# Patient Record
Sex: Female | Born: 1945 | Race: White | Hispanic: No | Marital: Married | Smoking: Never smoker
Health system: Southern US, Community
[De-identification: ages and names within clinical notes are randomized; demographics above are authoritative.]

## PROBLEM LIST (undated history)

## (undated) DIAGNOSIS — Z8679 Personal history of other diseases of the circulatory system: Secondary | ICD-10-CM

## (undated) DIAGNOSIS — R0989 Other specified symptoms and signs involving the circulatory and respiratory systems: Secondary | ICD-10-CM

## (undated) DIAGNOSIS — K116 Mucocele of salivary gland: Secondary | ICD-10-CM

## (undated) DIAGNOSIS — M8580 Other specified disorders of bone density and structure, unspecified site: Secondary | ICD-10-CM

## (undated) DIAGNOSIS — E782 Mixed hyperlipidemia: Secondary | ICD-10-CM

## (undated) DIAGNOSIS — Z87898 Personal history of other specified conditions: Secondary | ICD-10-CM

## (undated) DIAGNOSIS — I10 Essential (primary) hypertension: Secondary | ICD-10-CM

## (undated) DIAGNOSIS — K219 Gastro-esophageal reflux disease without esophagitis: Secondary | ICD-10-CM

## (undated) HISTORY — PX: TONSILLECTOMY: SUR1361

## (undated) HISTORY — DX: Personal history of other diseases of the circulatory system: Z86.79

## (undated) HISTORY — DX: Essential (primary) hypertension: I10

## (undated) HISTORY — DX: Personal history of other specified conditions: Z87.898

## (undated) HISTORY — DX: Other specified disorders of bone density and structure, unspecified site: M85.80

## (undated) HISTORY — DX: Mixed hyperlipidemia: E78.2

## (undated) HISTORY — DX: Mucocele of salivary gland: K11.6

## (undated) HISTORY — DX: Gastro-esophageal reflux disease without esophagitis: K21.9

## (undated) HISTORY — DX: Other specified symptoms and signs involving the circulatory and respiratory systems: R09.89

## (undated) HISTORY — PX: BREAST BIOPSY: SHX20

## (undated) HISTORY — PX: ABDOMINAL HYSTERECTOMY: SHX81

---

## 2000-03-20 HISTORY — PX: CARPAL TUNNEL RELEASE: SHX101

## 2013-08-04 ENCOUNTER — Ambulatory Visit: Payer: Self-pay | Admitting: Physician Assistant

## 2013-08-15 DIAGNOSIS — K219 Gastro-esophageal reflux disease without esophagitis: Secondary | ICD-10-CM

## 2013-08-15 DIAGNOSIS — I10 Essential (primary) hypertension: Secondary | ICD-10-CM | POA: Insufficient documentation

## 2013-08-15 DIAGNOSIS — E782 Mixed hyperlipidemia: Secondary | ICD-10-CM

## 2013-08-15 HISTORY — DX: Gastro-esophageal reflux disease without esophagitis: K21.9

## 2013-08-15 HISTORY — DX: Essential (primary) hypertension: I10

## 2013-08-15 HISTORY — DX: Mixed hyperlipidemia: E78.2

## 2013-10-29 ENCOUNTER — Ambulatory Visit: Payer: Self-pay | Admitting: Family Medicine

## 2013-10-30 DIAGNOSIS — R0989 Other specified symptoms and signs involving the circulatory and respiratory systems: Secondary | ICD-10-CM

## 2013-10-30 DIAGNOSIS — R198 Other specified symptoms and signs involving the digestive system and abdomen: Secondary | ICD-10-CM

## 2013-10-30 DIAGNOSIS — K116 Mucocele of salivary gland: Secondary | ICD-10-CM | POA: Insufficient documentation

## 2013-10-30 HISTORY — DX: Other specified symptoms and signs involving the digestive system and abdomen: R19.8

## 2013-10-30 HISTORY — DX: Other specified symptoms and signs involving the circulatory and respiratory systems: R09.89

## 2013-10-30 HISTORY — DX: Mucocele of salivary gland: K11.6

## 2014-02-26 DIAGNOSIS — M8580 Other specified disorders of bone density and structure, unspecified site: Secondary | ICD-10-CM

## 2014-02-26 HISTORY — DX: Other specified disorders of bone density and structure, unspecified site: M85.80

## 2014-11-12 ENCOUNTER — Other Ambulatory Visit: Payer: Self-pay | Admitting: Family Medicine

## 2014-11-12 DIAGNOSIS — Z1231 Encounter for screening mammogram for malignant neoplasm of breast: Secondary | ICD-10-CM

## 2014-11-17 ENCOUNTER — Ambulatory Visit
Admission: RE | Admit: 2014-11-17 | Discharge: 2014-11-17 | Disposition: A | Discharge status: Home / Self Care | Payer: Medicare Other | Source: Ambulatory Visit | Attending: Family Medicine | Admitting: Family Medicine

## 2014-11-17 DIAGNOSIS — Z1231 Encounter for screening mammogram for malignant neoplasm of breast: Secondary | ICD-10-CM | POA: Diagnosis not present

## 2015-02-16 ENCOUNTER — Encounter: Payer: Self-pay | Admitting: Urology

## 2015-02-16 ENCOUNTER — Ambulatory Visit (INDEPENDENT_AMBULATORY_CARE_PROVIDER_SITE_OTHER): Payer: Medicare Other | Admitting: Urology

## 2015-02-16 VITALS — BP 128/81 | HR 85 | Ht 67.0 in | Wt 186.5 lb

## 2015-02-16 DIAGNOSIS — R351 Nocturia: Secondary | ICD-10-CM

## 2015-02-16 DIAGNOSIS — R3129 Other microscopic hematuria: Secondary | ICD-10-CM

## 2015-02-16 DIAGNOSIS — Z87898 Personal history of other specified conditions: Secondary | ICD-10-CM

## 2015-02-16 DIAGNOSIS — Z8679 Personal history of other diseases of the circulatory system: Secondary | ICD-10-CM | POA: Insufficient documentation

## 2015-02-16 HISTORY — DX: Personal history of other specified conditions: Z87.898

## 2015-02-16 HISTORY — DX: Personal history of other diseases of the circulatory system: Z86.79

## 2015-02-16 LAB — URINALYSIS, COMPLETE
BILIRUBIN UA: NEGATIVE
GLUCOSE, UA: NEGATIVE
KETONES UA: NEGATIVE
NITRITE UA: NEGATIVE
Protein, UA: NEGATIVE
Urobilinogen, Ur: 0.2 mg/dL (ref 0.2–1.0)
pH, UA: 6 (ref 5.0–7.5)

## 2015-02-16 LAB — MICROSCOPIC EXAMINATION
Bacteria, UA: NONE SEEN
RBC MICROSCOPIC, UA: NONE SEEN /HPF (ref 0–?)

## 2015-02-16 NOTE — Progress Notes (Signed)
02/16/2015 9:48 AM   Denise Cisneros 09/08/1945 [id number]  Referring provider: Berneice GandyVicki S Fowler, MD [address] [address] [place], [place] [id number]  Chief Complaint  Patient presents with  . Hematuria    New Patient    HPI: The patient is a new patient who was found recently to have microscopic hematuria. The urine culture was negative on that visit. Of the urinalysis have been negative. She does not have a history of taking daily aspirin or blood thinners or smoking. She's had a hysterectomy. She's never had a kidney stone or previous GU surgery  She voids every every 3 hours is on once a night. She is not sure she is emptying since she can double void a milder amount  Modifying factors: There are no other modifying factors  Associated signs and symptoms: There are no other associated signs and symptoms Aggravating and relieving factors: There are no other aggravating or relieving factors Severity: Mild Duration: Persistent       PMH: Past Medical History  Diagnosis Date  . History of prolonged Q-T interval on ECG 02/16/2015    Overview:  comes and goes, negative stress test and normal ECHO 2014   . BP (high blood pressure) 08/15/2013  . Acid reflux 08/15/2013  . 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 Osteopenia 02/26/2014  . Globus pharyngeus 10/30/2013  . 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 Personal history of other diseases of the circulatory system 02/16/2015    Overview:  comes and goes, negative stress test and normal ECHO 2014     Surgical History: Past Surgical History  Procedure Laterality Date  . Breast biopsy Right 20 yrs ago    neg bx 2 times  . Abdominal hysterectomy    . Cesarean section    . Tonsillectomy    . Carpal tunnel release  2002    Home Medications:    Medication List       This list is accurate as of: 02/16/15  9:48 AM.  Always use your most recent med list.               atorvastatin 20 MG tablet    Commonly known as:  LIPITOR  TK 1 T PO QD        Allergies: No Known Allergies  Family History: Family History  Problem Relation Age of Onset  . Breast cancer Maternal Aunt 49  . Prostate cancer Neg Hx   . Kidney cancer Neg Hx   . Bladder Cancer Neg Hx     Social History:  reports that she has never smoked. She does not have any smokeless tobacco history on file. She reports that she does not drink alcohol or use illicit drugs.  ROS: UROLOGY Frequent Urination?: No Hard to postpone urination?: No Burning/pain with urination?: No Get up at night to urinate?: Yes Leakage of urine?: No Urine stream starts and stops?: No Trouble starting stream?: No Do you have to strain to urinate?: No Blood in urine?: Yes Urinary tract infection?: Yes Sexually transmitted disease?: No Injury to kidneys or bladder?: No Painful intercourse?: No Weak stream?: No Currently pregnant?: No Vaginal bleeding?: No Last menstrual period?: n  Gastrointestinal Nausea?: No Vomiting?: No Indigestion/heartburn?: No Diarrhea?: No Constipation?: No  Constitutional Fever: No Night sweats?: No Weight loss?: No Fatigue?: No  Skin Skin rash/lesions?: No Itching?: No  Eyes Blurred vision?: No Double vision?: No  Ears/Nose/Throat Sore throat?: No Sinus problems?: No  Hematologic/Lymphatic Swollen glands?:  No Easy bruising?: No  Cardiovascular Leg swelling?: No Chest pain?: No  Respiratory Cough?: No Shortness of breath?: No  Endocrine Excessive thirst?: No  Musculoskeletal Back pain?: No Joint pain?: No  Neurological Headaches?: No Dizziness?: No  Psychologic Depression?: No Anxiety?: No  Physical Exam: BP 128/81 mmHg  Pulse 85  Ht  (1.702 m)  Wt 186 lb 8 oz (84.596 kg)  BMI 29.20 kg/m2  Constitutional:  Alert and oriented, No acute distress. HEENT: [place] AT, moist mucus membranes.  Trachea midline, no masses. Cardiovascular: No clubbing, cyanosis, or  edema. Respiratory: Normal respiratory effort, no increased work of breathing. GI: Abdomen is soft, nontender, nondistended, no abdominal masses GU: No CVA tenderness.  Skin: No rashes, bruises or suspicious lesions. Lymph: No cervical or inguinal adenopathy. Neurologic: Grossly intact, no focal deficits, moving all 4 extremities. Psychiatric: Normal mood and affect.  Laboratory Data:  Urinalysis No results found for: COLORURINE, APPEARANCEUR, LABSPEC, PHURINE, GLUCOSEU, HGBUR, BILIRUBINUR, KETONESUR, PROTEINUR, UROBILINOGEN, NITRITE, LEUKOCYTESUR  Pertinent Imaging: None  Assessment & Plan:  The patient has microscopic hematuria. Her urinalysis was negative today. Workup described.  1. Microscopic hematuria 2. Nighttime frequency  Martina Sinner, MD  [facility] [address] [place], [place] [address] [phone]

## 2015-02-26 ENCOUNTER — Ambulatory Visit
Admission: RE | Admit: 2015-02-26 | Discharge: 2015-02-26 | Disposition: A | Discharge status: Home / Self Care | Payer: Medicare Other | Source: Ambulatory Visit | Attending: Urology | Admitting: Urology

## 2015-02-26 DIAGNOSIS — R3129 Other microscopic hematuria: Secondary | ICD-10-CM | POA: Insufficient documentation

## 2015-02-26 DIAGNOSIS — K5730 Diverticulosis of large intestine without perforation or abscess without bleeding: Secondary | ICD-10-CM | POA: Diagnosis not present

## 2015-02-26 MED ORDER — IOHEXOL 350 MG/ML SOLN
125.0000 mL | Freq: Once | INTRAVENOUS | Status: AC | PRN
  Administered 2015-02-26: 125 mL via INTRAVENOUS

## 2015-03-01 ENCOUNTER — Ambulatory Visit (INDEPENDENT_AMBULATORY_CARE_PROVIDER_SITE_OTHER): Payer: Medicare Other | Admitting: Urology

## 2015-03-01 ENCOUNTER — Encounter: Payer: Self-pay | Admitting: Urology

## 2015-03-01 VITALS — BP 153/90 | HR 114 | Ht 67.0 in | Wt 185.5 lb

## 2015-03-01 DIAGNOSIS — R3129 Other microscopic hematuria: Secondary | ICD-10-CM

## 2015-03-01 DIAGNOSIS — N390 Urinary tract infection, site not specified: Secondary | ICD-10-CM | POA: Diagnosis not present

## 2015-03-01 LAB — URINALYSIS, COMPLETE
BILIRUBIN UA: NEGATIVE
Glucose, UA: NEGATIVE
Ketones, UA: NEGATIVE
Nitrite, UA: NEGATIVE
PH UA: 5.5 (ref 5.0–7.5)
PROTEIN UA: NEGATIVE
Specific Gravity, UA: <1.005 — ABNORMAL LOW (ref 1.005–1.030)
Urobilinogen, Ur: 0.2 mg/dL (ref 0.2–1.0)

## 2015-03-01 LAB — MICROSCOPIC EXAMINATION: RBC MICROSCOPIC, UA: NONE SEEN /HPF (ref 0–?)

## 2015-03-01 NOTE — Progress Notes (Signed)
3:48 PM  03/01/2015   Denise Cisneros Jan 27, 1946 [id number]  Referring provider: Berneice Gandy, MD [address] [place], [place] [address]  Chief Complaint  Patient presents with  . Results    Ctscan results    HPI: 69 year old female who presents today for completion of her microscopic hematuria workup. She underwent a CT urogram which was negative for any GU pathology. She does have incidental bilateral parapelvic cysts but no other masses, tumors, or stones.  She does admit that she has had 5 UTIs over the past year.  She has been given estrogen cream in the past.    Today, she is concerned that she is having an infection.  Over the past few days, she's developed a "twinge" in her pelvis and some mild dysuria.  She describes these symptoms as consistent with her early UTI symptoms.        PMH: Past Medical History  Diagnosis Date  . History of prolonged Q-T interval on ECG 02/16/2015    Overview:  comes and goes, negative stress test and normal ECHO 2014   . BP (high blood pressure) 08/15/2013  . Acid reflux 08/15/2013  . 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 Osteopenia 02/26/2014  . Globus pharyngeus 10/30/2013  . 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 Personal history of other diseases of the circulatory system 02/16/2015    Overview:  comes and goes, negative stress test and normal ECHO 2014     Surgical History: Past Surgical History  Procedure Laterality Date  . Breast biopsy Right 20 yrs ago    neg bx 2 times  . Abdominal hysterectomy    . Cesarean section    . Tonsillectomy    . Carpal tunnel release  2002    Home Medications:    Medication List       This list is accurate as of: 03/01/15  3:48 PM.  Always use your most recent med list.               atorvastatin 20 MG tablet  Commonly known as:  LIPITOR  TK 1 T PO QD        Allergies: No Known Allergies  Family History: Family History  Problem  Relation Age of Onset  . Breast cancer Maternal Aunt 49  . Prostate cancer Neg Hx   . Kidney cancer Neg Hx   . Bladder Cancer Neg Hx     Social History:  reports that she has never smoked. She does not have any smokeless tobacco history on file. She reports that she does not drink alcohol or use illicit drugs.  ROS: UROLOGY Frequent Urination?: No Hard to postpone urination?: No Burning/pain with urination?: Yes Get up at night to urinate?: No Leakage of urine?: No Urine stream starts and stops?: No Trouble starting stream?: No Do you have to strain to urinate?: No Blood in urine?: No Urinary tract infection?: No Sexually transmitted disease?: No Injury to kidneys or bladder?: No Painful intercourse?: No Weak stream?: No Currently pregnant?: No Vaginal bleeding?: No Last menstrual period?: n  Gastrointestinal Nausea?: No Vomiting?: No Indigestion/heartburn?: No Diarrhea?: No Constipation?: No  Constitutional Fever: No Night sweats?: No Weight loss?: No Fatigue?: No  Skin Skin rash/lesions?: No Itching?: No  Eyes Blurred vision?: No Double vision?: No  Ears/Nose/Throat Sore throat?: No Sinus problems?: No  Hematologic/Lymphatic Swollen glands?: No Easy bruising?: No  Cardiovascular Leg swelling?: No Chest pain?: No  Respiratory Cough?: No  Shortness of breath?: No  Endocrine Excessive thirst?: No  Musculoskeletal Back pain?: No Joint pain?: No  Neurological Headaches?: No Dizziness?: No  Psychologic Depression?: No Anxiety?: No  Physical Exam: BP 153/90 mmHg  Pulse 114  Ht  (1.702 m)  Wt 185 lb 8 oz (84.142 kg)  BMI 29.05 kg/m2  Constitutional:  Alert and oriented, No acute distress. HEENT: [place] AT, moist mucus membranes.  Trachea midline, no masses. Cardiovascular: No clubbing, cyanosis, or edema. Respiratory: Normal respiratory effort, no increased work of breathing. GI: Abdomen is soft, nontender, nondistended, no  abdominal masses GU: No CVA tenderness.  Skin: No rashes, bruises or suspicious lesions. Neurologic: Grossly intact, no focal deficits, moving all 4 extremities. Psychiatric: Normal mood and affect.  Laboratory Data:  Urinalysis Results for orders placed or performed in visit on 03/01/15  Microscopic Examination  Result Value Ref Range   WBC, UA 11-30 (A) 0 -  5 /hpf   RBC, UA None seen 0 -  2 /hpf   Epithelial Cells (non renal) 0-10 0 - 10 /hpf   Renal Epithel, UA 0-10 (A) None seen /hpf   Bacteria, UA Few (A) None seen/Few  Urinalysis, Complete  Result Value Ref Range   Specific Gravity, UA <1.005 (L) 1.005 - 1.030   pH, UA 5.5 5.0 - 7.5   Color, UA Yellow Yellow   Appearance Ur Clear Clear   Leukocytes, UA 1+ (A) Negative   Protein, UA Negative Negative/Trace   Glucose, UA Negative Negative   Ketones, UA Negative Negative   RBC, UA Trace (A) Negative   Bilirubin, UA Negative Negative   Urobilinogen, Ur 0.2 0.2 - 1.0 mg/dL   Nitrite, UA Negative Negative   Microscopic Examination See below:      Pertinent Imaging: CLINICAL DATA: Microscopic hematuria for 4 months. Previous hysterectomy.  EXAM: CT ABDOMEN AND PELVIS WITHOUT AND WITH CONTRAST  TECHNIQUE: Multidetector CT imaging of the abdomen and pelvis was performed following the standard protocol before and following the bolus administration of intravenous contrast.  CONTRAST: OMNIPAQUE IOHEXOL 350 MG/ML SOLN  COMPARISON: None.  FINDINGS: Lower chest: Clear lung bases. No significant pleural or pericardial effusion. Mild emphysematous changes at the lung bases.  Hepatobiliary: The liver is normal in density without focal abnormality. No evidence of gallstones, gallbladder wall thickening or biliary dilatation.  Pancreas: Unremarkable. No pancreatic ductal dilatation or surrounding inflammatory changes.  Spleen: Normal in size without focal abnormality.  Adrenals/Urinary Tract: Both  adrenal glands appear normal. Pre-contrast images demonstrate no renal, ureteral or bladder calculi. Post-contrast, both kidneys enhance normally. There is no evidence of enhancing renal mass. There are multiple parapelvic and renal sinus cysts bilaterally. There are tiny low-density renal cortical lesions as well, likely cysts. Delayed images result in segmental visualization of the ureters. No urothelial abnormalities are identified. The bladder appears unremarkable.  Stomach/Bowel: No evidence of bowel wall thickening, distention or surrounding inflammatory change. Mild distal colonic diverticulosis. The appendix appears normal.  Vascular/Lymphatic: There are no enlarged abdominal or pelvic lymph nodes. Mild aortoiliac atherosclerosis.  Reproductive: Hysterectomy. No evidence of adnexal mass.  Other: No evidence of abdominal wall mass or hernia.  Musculoskeletal: No acute or significant osseous findings. Lower lumbar spine facet disease noted.  IMPRESSION: 1. No cause for hematuria identified. No evidence of urinary tract calculus, renal mass or urothelial lesion. 2. Multiple parapelvic/renal sinus cysts bilaterally. 3. Mild atherosclerosis and sigmoid diverticulosis.   Electronically Signed  By: Carey Bullocks M.D.  On: 02/26/2015  12:26   Assessment & Plan:  1. Microscopic hematuria Cystoscopy deferred today due to borderline UA and patient concern for possible UTI. We'll send UA for urine culture and treat as needed. Plan to reschedule cystoscopy in 1-2 weeks. CT urogram was reviewed today with the patient and she was given the opportunity to see the images. No GU pathology. - Urinalysis, Complete - CULTURE, URINE COMPREHENSIVE  2. Recurrent UTI As above Discuss common etiology of recurrent urinary tract infections in postmenopausal women. We discussed that she may benefit from the addition of vaginal estrogen cream in the future. She notes that she is  been given this medication in the past but has not used it.  Reschedule cystoscopy. Treat urine culture as needed based on culture and sensitivity data.  Vanna ScotlandAshley Javarie Crisp, MD  [facility] [address], [address] [place], [place] [phone]

## 2015-03-03 ENCOUNTER — Telehealth: Payer: Self-pay

## 2015-03-03 LAB — CULTURE, URINE COMPREHENSIVE

## 2015-03-03 NOTE — Telephone Encounter (Signed)
-----   Message from Denise ScotlandAshley Brandon, MD sent at 03/03/2015 12:32 PM EST ----- Please let this patient know no true infection.  There is contamination from her vagina.  We are OK to proceed with cystoscopy at next visit.  No need for abx.    Denise ScotlandAshley Brandon, MD

## 2015-03-03 NOTE — Telephone Encounter (Signed)
LMOM- no infection can proceed as planned at next visit.

## 2015-03-12 ENCOUNTER — Ambulatory Visit (INDEPENDENT_AMBULATORY_CARE_PROVIDER_SITE_OTHER): Payer: Medicare Other | Admitting: Urology

## 2015-03-12 ENCOUNTER — Other Ambulatory Visit: Payer: Self-pay

## 2015-03-12 VITALS — BP 136/83 | HR 79 | Ht 67.0 in | Wt 187.6 lb

## 2015-03-12 DIAGNOSIS — R3129 Other microscopic hematuria: Secondary | ICD-10-CM | POA: Diagnosis not present

## 2015-03-12 DIAGNOSIS — N390 Urinary tract infection, site not specified: Secondary | ICD-10-CM | POA: Diagnosis not present

## 2015-03-12 LAB — URINALYSIS, COMPLETE
Bilirubin, UA: NEGATIVE
GLUCOSE, UA: NEGATIVE
KETONES UA: NEGATIVE
NITRITE UA: NEGATIVE
Protein, UA: NEGATIVE
RBC, UA: NEGATIVE
UUROB: 0.2 mg/dL (ref 0.2–1.0)
pH, UA: 7 (ref 5.0–7.5)

## 2015-03-12 LAB — MICROSCOPIC EXAMINATION
BACTERIA UA: NONE SEEN
RBC MICROSCOPIC, UA: NONE SEEN /HPF (ref 0–?)

## 2015-03-12 NOTE — Progress Notes (Signed)
03/12/2015 9:11 AM   Orrin Brigham Fell 07-17-1945 [id number]  Referring provider: Berneice Gandy, MD [address] [place], [place] [address]  Chief Complaint  Patient presents with  . Cysto    recurrent UTI, microscopic hematuria     HPI: 69 year old female who presents today for completion of her microscopic hematuria workup. She underwent a CT urogram which was negative for any GU pathology. She does have incidental bilateral parapelvic cysts but no other masses, tumors, or stones.  She does admit that she has had 5 UTIs over the past year. She has been given estrogen cream in the past but has not tried it before.         PMH: Past Medical History  Diagnosis Date  . History of prolonged Q-T interval on ECG 02/16/2015    Overview:  comes and goes, negative stress test and normal ECHO 2014   . BP (high blood pressure) 08/15/2013  . Acid reflux 08/15/2013  . 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 Osteopenia 02/26/2014  . Globus pharyngeus 10/30/2013  . 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 Personal history of other diseases of the circulatory system 02/16/2015    Overview:  comes and goes, negative stress test and normal ECHO 2014   . BP (high blood pressure) 08/15/2013    Surgical History: Past Surgical History  Procedure Laterality Date  . Breast biopsy Right 20 yrs ago    neg bx 2 times  . Abdominal hysterectomy    . Cesarean section    . Tonsillectomy    . Carpal tunnel release  2002    Home Medications:    Medication List       This list is accurate as of: 03/12/15  9:11 AM.  Always use your most recent med list.               atorvastatin 20 MG tablet  Commonly known as:  LIPITOR  TK 1 T PO QD     conjugated estrogens vaginal cream  Commonly known as:  PREMARIN  Apply 0.5 gm around urethra and vaginal area twice weekly for vaginal dryness        Allergies: No Known Allergies  Family History: Family  History  Problem Relation Age of Onset  . Breast cancer Maternal Aunt 49  . Prostate cancer Neg Hx   . Kidney cancer Neg Hx   . Bladder Cancer Neg Hx     Social History:  reports that she has never smoked. She does not have any smokeless tobacco history on file. She reports that she does not drink alcohol or use illicit drugs.  ROS:                                        Physical Exam: BP 136/83 mmHg  Pulse 79  Ht  (1.702 m)  Wt 187 lb 9.6 oz (85.095 kg)  BMI 29.38 kg/m2  Constitutional:  Alert and oriented, No acute distress. HEENT: [place] AT, moist mucus membranes.  Trachea midline, no masses. Cardiovascular: No clubbing, cyanosis, or edema. Respiratory: Normal respiratory effort, no increased work of breathing. GI: Abdomen is soft, nontender, nondistended, no abdominal masses GU: No CVA tenderness.  Skin: No rashes, bruises or suspicious lesions. Lymph: No cervical or inguinal adenopathy. Neurologic: Grossly intact, no focal deficits, moving all 4 extremities. Psychiatric: Normal mood and affect.  Laboratory Data: No results found for: WBC, HGB, HCT, MCV, PLT  No results found for: CREATININE  No results found for: PSA  No results found for: TESTOSTERONE  No results found for: HGBA1C  Urinalysis    Component Value Date/Time   GLUCOSEU Negative 03/01/2015 1518   BILIRUBINUR Negative 03/01/2015 1518   NITRITE Negative 03/01/2015 1518   LEUKOCYTESUR 1+* 03/01/2015 1518    Pertinent Imaging: EXAM: CT ABDOMEN AND PELVIS WITHOUT AND WITH CONTRAST  TECHNIQUE: Multidetector CT imaging of the abdomen and pelvis was performed following the standard protocol before and following the bolus administration of intravenous contrast.  CONTRAST: 125mL OMNIPAQUE IOHEXOL 350 MG/ML SOLN  COMPARISON: None.  FINDINGS: Lower chest: Clear lung bases. No significant pleural or pericardial effusion. Mild emphysematous changes at the lung  bases.  Hepatobiliary: The liver is normal in density without focal abnormality. No evidence of gallstones, gallbladder wall thickening or biliary dilatation.  Pancreas: Unremarkable. No pancreatic ductal dilatation or surrounding inflammatory changes.  Spleen: Normal in size without focal abnormality.  Adrenals/Urinary Tract: Both adrenal glands appear normal. Pre-contrast images demonstrate no renal, ureteral or bladder calculi. Post-contrast, both kidneys enhance normally. There is no evidence of enhancing renal mass. There are multiple parapelvic and renal sinus cysts bilaterally. There are tiny low-density renal cortical lesions as well, likely cysts. Delayed images result in segmental visualization of the ureters. No urothelial abnormalities are identified. The bladder appears unremarkable.  Stomach/Bowel: No evidence of bowel wall thickening, distention or surrounding inflammatory change. Mild distal colonic diverticulosis. The appendix appears normal.  Vascular/Lymphatic: There are no enlarged abdominal or pelvic lymph nodes. Mild aortoiliac atherosclerosis.  Reproductive: Hysterectomy. No evidence of adnexal mass.  Other: No evidence of abdominal wall mass or hernia.  Musculoskeletal: No acute or significant osseous findings. Lower lumbar spine facet disease noted.  IMPRESSION: 1. No cause for hematuria identified. No evidence of urinary tract calculus, renal mass or urothelial lesion. 2. Multiple parapelvic/renal sinus cysts bilaterally. 3. Mild atherosclerosis and sigmoid diverticulosis.   Cystoscopy Procedure Note  Patient identification was confirmed, informed consent was obtained, and patient was prepped using Betadine solution.  Lidocaine jelly was administered per urethral meatus.    Preoperative abx where received prior to procedure.    Procedure: - Flexible cystoscope introduced, without any difficulty.   - Thorough search of the bladder  revealed:    normal urethral meatus    normal urothelium    no stones    no ulcers     no tumors    no urethral polyps    no trabeculation  - Ureteral orifices were normal in position and appearance.  Post-Procedure: - Patient tolerated the procedure well   Assessment & Plan:    1. Microscopic hematuria Hematuria workup negative. She will need a repeat urinalysis in one year.  2. Recurrent UTI I again discussed the common etiology of recurrent urinary tract infections in postmenopausal women. We discussed that she may benefit from the addition of vaginal estrogen cream in the future. She notes that she is been given this medication in the past but has not used it. We discussed using this medication again today. She was instructed on how to use it. She is agreeable to trying this medication to minimize her recurrent urinary tract infections. [place] May [place]. We will have her follow-up in May 2017 to discuss her status in regards to her urinary tract infections. She will let our office know if she has any issues  in the interim.  Return in about 4 months (around 07/19/2015).  Hildred Laser, MD  [facility] [address] [place], [place] [address] [phone]

## 2015-03-16 ENCOUNTER — Other Ambulatory Visit: Payer: Medicare Other | Admitting: Urology

## 2015-08-04 ENCOUNTER — Encounter: Payer: Self-pay | Admitting: Urology

## 2015-08-04 ENCOUNTER — Ambulatory Visit: Payer: Medicare Other

## 2015-08-04 ENCOUNTER — Ambulatory Visit (INDEPENDENT_AMBULATORY_CARE_PROVIDER_SITE_OTHER): Payer: Medicare Other | Admitting: Urology

## 2015-08-04 VITALS — BP 143/82 | HR 78 | Ht 67.0 in | Wt 191.0 lb

## 2015-08-04 DIAGNOSIS — R3129 Other microscopic hematuria: Secondary | ICD-10-CM

## 2015-08-04 DIAGNOSIS — N390 Urinary tract infection, site not specified: Secondary | ICD-10-CM

## 2015-08-04 LAB — URINALYSIS, COMPLETE
BILIRUBIN UA: NEGATIVE
Glucose, UA: NEGATIVE
Ketones, UA: NEGATIVE
Nitrite, UA: NEGATIVE
PH UA: 5 (ref 5.0–7.5)
PROTEIN UA: NEGATIVE
Specific Gravity, UA: 1.015 (ref 1.005–1.030)
Urobilinogen, Ur: 0.2 mg/dL (ref 0.2–1.0)

## 2015-08-04 LAB — MICROSCOPIC EXAMINATION
BACTERIA UA: NONE SEEN
Epithelial Cells (non renal): NONE SEEN /hpf (ref 0–10)

## 2015-08-04 NOTE — Progress Notes (Signed)
08/04/2015 1:46 PM   Denise Cisneros 07-Jan-1946 [id number]  Referring provider: Berneice Gandy, MD [address] [place], [place] [address]  Chief Complaint  Patient presents with  . Follow-up    recurrent UTI    HPI: 70 year old female who presents today for completion of her microscopic hematuria workup. She underwent a CT urogram which was negative for any GU pathology. She does have incidental bilateral parapelvic cysts but no other masses, tumors, or stones.  She does admit that she has had 5 UTIs over the past year. She Was recently started on estrogen cream by her primary care provider. She uses twice per week. She's had no breakthrough of symptomatic urinary tract infections that time. He is very happy with this medication. He denies any history of gross hematuria.   PMH: Past Medical History  Diagnosis Date  . History of prolonged Q-T interval on ECG 02/16/2015    Overview:  comes and goes, negative stress test and normal ECHO 2014   . BP (high blood pressure) 08/15/2013  . Acid reflux 08/15/2013  . 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 Osteopenia 02/26/2014  . Globus pharyngeus 10/30/2013  . 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 Personal history of other diseases of the circulatory system 02/16/2015    Overview:  comes and goes, negative stress test and normal ECHO 2014   . BP (high blood pressure) 08/15/2013    Surgical History: Past Surgical History  Procedure Laterality Date  . Breast biopsy Right 20 yrs ago    neg bx 2 times  . Abdominal hysterectomy    . Cesarean section    . Tonsillectomy    . Carpal tunnel release  2002    Home Medications:    Medication List       This list is accurate as of: 08/04/15  1:46 PM.  Always use your most recent med list.               atorvastatin 20 MG tablet  Commonly known as:  LIPITOR  TK 1 T PO QD     conjugated estrogens vaginal cream  Commonly known as:  PREMARIN    Reported on 08/04/2015        Allergies: No Known Allergies  Family History: Family History  Problem Relation Age of Onset  . Breast cancer Maternal Aunt 49  . Prostate cancer Neg Hx   . Kidney cancer Neg Hx   . Bladder Cancer Neg Hx     Social History:  reports that she has never smoked. She does not have any smokeless tobacco history on file. She reports that she does not drink alcohol or use illicit drugs.  ROS:                                        Physical Exam: BP 143/82 mmHg  Pulse 78  Ht  (1.702 m)  Wt 191 lb (86.637 kg)  BMI 29.91 kg/m2  Constitutional:  Alert and oriented, No acute distress. HEENT:  AT, moist mucus membranes.  Trachea midline, no masses. Cardiovascular: No clubbing, cyanosis, or edema. Respiratory: Normal respiratory effort, no increased work of breathing. GI: Abdomen is soft, nontender, nondistended, no abdominal masses GU: No CVA tenderness.  Skin: No rashes, bruises or suspicious lesions. Lymph: No cervical or inguinal adenopathy. Neurologic: Grossly intact, no focal deficits, moving all 4  extremities. Psychiatric: Normal mood and affect.  Laboratory Data: No results found for: WBC, HGB, HCT, MCV, PLT  No results found for: CREATININE  No results found for: PSA  No results found for: TESTOSTERONE  No results found for: HGBA1C  Urinalysis    Component Value Date/Time   APPEARANCEUR Clear 03/12/2015 0830   GLUCOSEU Negative 03/12/2015 0830   BILIRUBINUR Negative 03/12/2015 0830   PROTEINUR Negative 03/12/2015 0830   NITRITE Negative 03/12/2015 0830   LEUKOCYTESUR 1+* 03/12/2015 0830     Assessment & Plan:    1. Microscopic hematuria Hematuria workup negative. U/A negative today  2. Recurrent UTI The patient will continue her vaginal estrogen cream twice per week. This is been prescribed for her by her primary care provider. She'll contact us if she develops asymptomatic urinary tract  infection. She'll otherwise follow-up in one year.   Return in about 1 year (around 08/03/2016).  Hildred LaserBrian James Safiatou Islam, MD  [facility] [address], [address] [place], [place] [address] [phone]

## 2015-11-11 ENCOUNTER — Other Ambulatory Visit: Payer: Self-pay | Admitting: Family Medicine

## 2015-11-11 DIAGNOSIS — Z1231 Encounter for screening mammogram for malignant neoplasm of breast: Secondary | ICD-10-CM

## 2015-11-18 ENCOUNTER — Ambulatory Visit
Admission: RE | Admit: 2015-11-18 | Discharge: 2015-11-18 | Disposition: A | Discharge status: Home / Self Care | Payer: Medicare Other | Source: Ambulatory Visit | Attending: Family Medicine | Admitting: Family Medicine

## 2015-11-18 ENCOUNTER — Other Ambulatory Visit: Payer: Self-pay | Admitting: Family Medicine

## 2015-11-18 DIAGNOSIS — Z1231 Encounter for screening mammogram for malignant neoplasm of breast: Secondary | ICD-10-CM

## 2015-12-24 ENCOUNTER — Other Ambulatory Visit: Payer: Self-pay | Admitting: Family Medicine

## 2015-12-24 DIAGNOSIS — Z780 Asymptomatic menopausal state: Secondary | ICD-10-CM

## 2016-01-24 ENCOUNTER — Ambulatory Visit
Admission: RE | Admit: 2016-01-24 | Discharge: 2016-01-24 | Disposition: A | Discharge status: Home / Self Care | Payer: Medicare Other | Source: Ambulatory Visit | Attending: Family Medicine | Admitting: Family Medicine

## 2016-01-24 ENCOUNTER — Encounter (INDEPENDENT_AMBULATORY_CARE_PROVIDER_SITE_OTHER): Payer: Self-pay

## 2016-01-24 DIAGNOSIS — M85851 Other specified disorders of bone density and structure, right thigh: Secondary | ICD-10-CM | POA: Diagnosis not present

## 2016-01-24 DIAGNOSIS — Z780 Asymptomatic menopausal state: Secondary | ICD-10-CM | POA: Insufficient documentation

## 2016-08-04 ENCOUNTER — Ambulatory Visit (INDEPENDENT_AMBULATORY_CARE_PROVIDER_SITE_OTHER): Payer: Medicare Other | Admitting: Urology

## 2016-08-04 ENCOUNTER — Encounter: Payer: Self-pay | Admitting: Urology

## 2016-08-04 ENCOUNTER — Ambulatory Visit: Payer: Medicare Other

## 2016-08-04 VITALS — BP 117/73 | HR 87 | Ht 67.0 in | Wt 187.6 lb

## 2016-08-04 DIAGNOSIS — R3129 Other microscopic hematuria: Secondary | ICD-10-CM

## 2016-08-04 DIAGNOSIS — Z87440 Personal history of urinary (tract) infections: Secondary | ICD-10-CM

## 2016-08-04 LAB — MICROSCOPIC EXAMINATION

## 2016-08-04 LAB — URINALYSIS, COMPLETE
Bilirubin, UA: NEGATIVE
Glucose, UA: NEGATIVE
Ketones, UA: NEGATIVE
Nitrite, UA: NEGATIVE
PH UA: 6 (ref 5.0–7.5)
Protein, UA: NEGATIVE
RBC, UA: NEGATIVE
Urobilinogen, Ur: 0.2 mg/dL (ref 0.2–1.0)

## 2016-08-07 NOTE — Progress Notes (Signed)
08/04/2016 3:18 PM   Denise Cisneros January 23, 1946 [id number]  Referring provider: Verner Mould, MD [address] [facility] [place], [place] [address]  Chief Complaint  Patient presents with  . Recurrent UTI    HPI: 71 year old female who presents today for annual follow-up.  She was previously seen for evaluation of microscopic hematuria as well as recurrent urinary tract infections.  Over the past year, she's been doing extremely well. She has no urinary complaints today. She had no infections over the past year.  Status post endoscopic hematuria workup on 12/16 including CT urogram and cystoscopy which was unremarkable.  UA today shows no evidence of microscopic blood.  She has been using topical estrogen cream intermittently.  She also takes daily Farris Has tablets.   PMH: Past Medical History:  Diagnosis Date  . Acid reflux 08/15/2013  . BP (high blood pressure) 08/15/2013  . BP (high blood pressure) 08/15/2013  . 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 Globus pharyngeus 10/30/2013  . History of prolonged Q-T interval on ECG 02/16/2015   Overview:  comes and goes, negative stress test and normal ECHO 2014   . 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 Osteopenia 02/26/2014  . Personal history of other diseases of the circulatory system 02/16/2015   Overview:  comes and goes, negative stress test and normal ECHO 2014     Surgical History: Past Surgical History:  Procedure Laterality Date  . ABDOMINAL HYSTERECTOMY    . BREAST BIOPSY Right 20 yrs ago   neg bx 2 times  . CARPAL TUNNEL RELEASE  2002  . CESAREAN SECTION    . TONSILLECTOMY      Home Medications:  Allergies as of 08/04/2016   No Known Allergies     Medication List       Accurate as of 08/04/16 11:59 PM. Always use your most recent med list.          atorvastatin 20 MG tablet Commonly known as:  LIPITOR TK 1 T PO QD   conjugated estrogens vaginal  cream Commonly known as:  PREMARIN Reported on 08/04/2015   lisinopril 10 MG tablet Commonly known as:  PRINIVIL,ZESTRIL   meloxicam 15 MG tablet Commonly known as:  MOBIC Take by mouth.   metroNIDAZOLE 1 % gel Commonly known as:  METROGEL APPLY 1 APPLICATION ON THE SKIN DAILY   Vitamin D3 2000 units capsule Take by mouth.       Allergies: No Known Allergies  Family History: Family History  Problem Relation Age of Onset  . Breast cancer Maternal Aunt 49  . Prostate cancer Neg Hx   . Kidney cancer Neg Hx   . Bladder Cancer Neg Hx     Social History:  reports that she has never smoked. She has never used smokeless tobacco. She reports that she does not drink alcohol or use drugs.  ROS: UROLOGY Frequent Urination?: No Hard to postpone urination?: No Burning/pain with urination?: No Get up at night to urinate?: No Leakage of urine?: No Urine stream starts and stops?: No Trouble starting stream?: No Do you have to strain to urinate?: No Blood in urine?: No Urinary tract infection?: No Sexually transmitted disease?: No Injury to kidneys or bladder?: No Painful intercourse?: No Weak stream?: No Currently pregnant?: No Vaginal bleeding?: No Last menstrual period?: n  Gastrointestinal Nausea?: No Vomiting?: No Indigestion/heartburn?: No Diarrhea?: No Constipation?: No  Constitutional Fever: No Night sweats?: No Weight loss?: No Fatigue?: No  Skin  Skin rash/lesions?: No Itching?: No  Eyes Blurred vision?: No Double vision?: No  Ears/Nose/Throat Sore throat?: No Sinus problems?: No  Hematologic/Lymphatic Swollen glands?: No Easy bruising?: No  Cardiovascular Leg swelling?: No Chest pain?: No  Respiratory Cough?: No Shortness of breath?: No  Endocrine Excessive thirst?: No  Musculoskeletal Back pain?: No Joint pain?: No  Neurological Headaches?: No Dizziness?: No  Psychologic Depression?: No Anxiety?: No  Physical Exam: BP  117/73 (BP Location: Left Arm, Patient Position: Sitting, Cuff Size: Normal)   Pulse 87   Ht 5\' 7"  (1.702 m)   Wt 187 lb 9.6 oz (85.1 kg)   BMI 29.38 kg/m   Constitutional:  Alert and oriented, No acute distress. HEENT: [place] AT, moist mucus membranes.  Trachea midline, no masses. Cardiovascular: No clubbing, cyanosis, or edema. Respiratory: Normal respiratory effort, no increased work of breathing. GI: Abdomen is soft, nontender, nondistended, no abdominal masses GU: No CVA tenderness.  Skin: No rashes, bruises or suspicious lesions. Neurologic: Grossly intact, no focal deficits, moving all 4 extremities. Psychiatric: Normal mood and affect.  Laboratory Data: N/a  Urinalysis Results for orders placed or performed in visit on 08/04/16  Microscopic Examination  Result Value Ref Range   WBC, UA 0-5 0 - 5 /hpf   RBC, UA 0-2 0 - 2 /hpf   Epithelial Cells (non renal) 0-10 0 - 10 /hpf   Renal Epithel, UA 0-10 (A) None seen /hpf   Bacteria, UA Few None seen/Few  Urinalysis, Complete  Result Value Ref Range   Specific Gravity, UA <1.005 (L) 1.005 - 1.030   pH, UA 6.0 5.0 - 7.5   Color, UA Yellow Yellow   Appearance Ur Clear Clear   Leukocytes, UA 1+ (A) Negative   Protein, UA Negative Negative/Trace   Glucose, UA Negative Negative   Ketones, UA Negative Negative   RBC, UA Negative Negative   Bilirubin, UA Negative Negative   Urobilinogen, Ur 0.2 0.2 - 1.0 mg/dL   Nitrite, UA Negative Negative   Microscopic Examination See below:     Pertinent Imaging: No new imaging  Assessment & Plan:    1. History of recurrent UTI (urinary tract infection) No infections over the past year, doing well Continue topical estrogen cream periodically indefinitely as this seems to be effective for her hygiene/cranberry tablet use reviewed again today Advised to call if she has signs or symptoms of infection for same-day nurse visit  2. Microscopic hematuria No evidence of microscopic blood on  UA today, status post negative workup 2016 - Urinalysis, Complete  Follow-up as needed  Vanna ScotlandAshley Cherilynn Schomburg, MD  [facility] [address]  [place], [place] [address] [phone]

## 2016-10-16 ENCOUNTER — Other Ambulatory Visit: Payer: Self-pay | Admitting: Family Medicine

## 2016-10-16 DIAGNOSIS — Z1231 Encounter for screening mammogram for malignant neoplasm of breast: Secondary | ICD-10-CM

## 2016-12-04 ENCOUNTER — Ambulatory Visit
Admission: RE | Admit: 2016-12-04 | Discharge: 2016-12-04 | Disposition: A | Discharge status: Home / Self Care | Payer: Medicare Other | Source: Ambulatory Visit | Attending: Family Medicine | Admitting: Family Medicine

## 2016-12-04 DIAGNOSIS — Z1231 Encounter for screening mammogram for malignant neoplasm of breast: Secondary | ICD-10-CM | POA: Insufficient documentation

## 2017-01-19 ENCOUNTER — Ambulatory Visit (INDEPENDENT_AMBULATORY_CARE_PROVIDER_SITE_OTHER): Payer: Medicare Other

## 2017-01-19 DIAGNOSIS — N390 Urinary tract infection, site not specified: Secondary | ICD-10-CM

## 2017-01-19 LAB — URINALYSIS, COMPLETE
BILIRUBIN UA: NEGATIVE
GLUCOSE, UA: NEGATIVE
KETONES UA: NEGATIVE
Nitrite, UA: NEGATIVE
PH UA: 5.5 (ref 5.0–7.5)
SPEC GRAV UA: 1.02 (ref 1.005–1.030)
Urobilinogen, Ur: 0.2 mg/dL (ref 0.2–1.0)

## 2017-01-19 LAB — MICROSCOPIC EXAMINATION
RBC, UA: >30 /hpf — ABNORMAL HIGH (ref 0–?)
WBC, UA: >30 /hpf — ABNORMAL HIGH (ref 0–?)

## 2017-01-19 MED ORDER — CEPHALEXIN 500 MG PO CAPS: 500.0000 mg | ORAL_CAPSULE | Freq: Three times a day (TID) | ORAL | 0 refills | Status: AC

## 2017-01-19 NOTE — Progress Notes (Signed)
Pt presents today with c/o urinary frequency and some burning upon urination. A clean catch urine was collected for u/a and cx.   There were no vitals taken for this visit.  Per Dr. Apolinar JunesBrandon keflex tid x7 days was sent into pharmacy.

## 2017-01-23 LAB — CULTURE, URINE COMPREHENSIVE

## 2017-01-31 ENCOUNTER — Ambulatory Visit (INDEPENDENT_AMBULATORY_CARE_PROVIDER_SITE_OTHER): Payer: Medicare Other

## 2017-01-31 VITALS — BP 145/83 | HR 83 | Ht 66.0 in | Wt 180.0 lb

## 2017-01-31 DIAGNOSIS — R350 Frequency of micturition: Secondary | ICD-10-CM

## 2017-01-31 NOTE — Progress Notes (Signed)
Pt presents today with c/o frequency. Pt has been on abx in the last 30 days for a UTI. A clean catch was obtained for u/a and cx.  Blood pressure (!) 145/83, pulse 83, height 5\' 6"  (1.676 m), weight 180 lb (81.6 kg).

## 2017-02-01 LAB — URINALYSIS, COMPLETE
BILIRUBIN UA: NEGATIVE
GLUCOSE, UA: NEGATIVE
KETONES UA: NEGATIVE
NITRITE UA: NEGATIVE
PROTEIN UA: NEGATIVE
RBC, UA: NEGATIVE
UUROB: 0.2 mg/dL (ref 0.2–1.0)
pH, UA: 5.5 (ref 5.0–7.5)

## 2017-02-01 LAB — MICROSCOPIC EXAMINATION
Epithelial Cells (non renal): NONE SEEN /hpf (ref 0–10)
RBC MICROSCOPIC, UA: NONE SEEN /HPF (ref 0–?)

## 2017-02-02 ENCOUNTER — Telehealth: Payer: Self-pay

## 2017-02-02 MED ORDER — SULFAMETHOXAZOLE-TRIMETHOPRIM 800-160 MG PO TABS: 1.0000 | ORAL_TABLET | Freq: Two times a day (BID) | ORAL | 0 refills | Status: AC

## 2017-02-02 NOTE — Telephone Encounter (Signed)
-----   Message from Vanna ScotlandAshley Brandon, MD sent at 02/02/2017 10:43 AM EST ----- Urine culture so far is growing a very low colony count of gram-negative rods which may be a sign of an infection.  I would like to go ahead and treat her with Bactrim DS twice daily for 7 days which may need to be adjusted Monday based on what her urine culture ultimately gross.  I just wanted to go ahead and treat her before the weekend.  Vanna ScotlandAshley Brandon, MD

## 2017-02-02 NOTE — Telephone Encounter (Signed)
LMOM-medication sent to pharmacy 

## 2017-02-06 NOTE — Telephone Encounter (Signed)
Pt left a message on triage line that she picked up medication and started it on Friday.

## 2017-02-07 LAB — CULTURE, URINE COMPREHENSIVE

## 2017-11-09 ENCOUNTER — Other Ambulatory Visit: Payer: Self-pay | Admitting: Family Medicine

## 2017-11-09 DIAGNOSIS — Z1231 Encounter for screening mammogram for malignant neoplasm of breast: Secondary | ICD-10-CM

## 2018-01-07 ENCOUNTER — Ambulatory Visit
Admission: RE | Admit: 2018-01-07 | Discharge: 2018-01-07 | Disposition: A | Discharge status: Home / Self Care | Payer: Medicare Other | Source: Ambulatory Visit | Attending: Family Medicine | Admitting: Family Medicine

## 2018-01-07 DIAGNOSIS — Z1231 Encounter for screening mammogram for malignant neoplasm of breast: Secondary | ICD-10-CM | POA: Insufficient documentation

## 2018-11-13 IMAGING — MG MM DIGITAL SCREENING BILAT W/ TOMO W/ CAD
8 series · 8 of 24 positions shown · non-contrast
Comparison: Previous exam(s).

CLINICAL DATA: Screening.

EXAM:
DIGITAL SCREENING BILATERAL MAMMOGRAM WITH TOMO AND CAD

[R CC synth-2D]
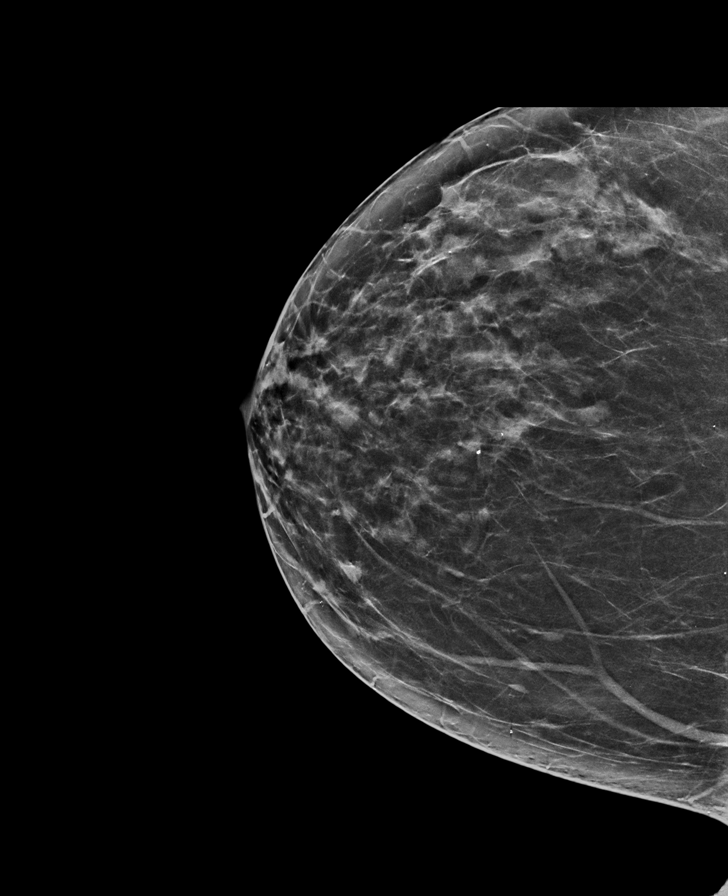

[R MLO synth-2D]
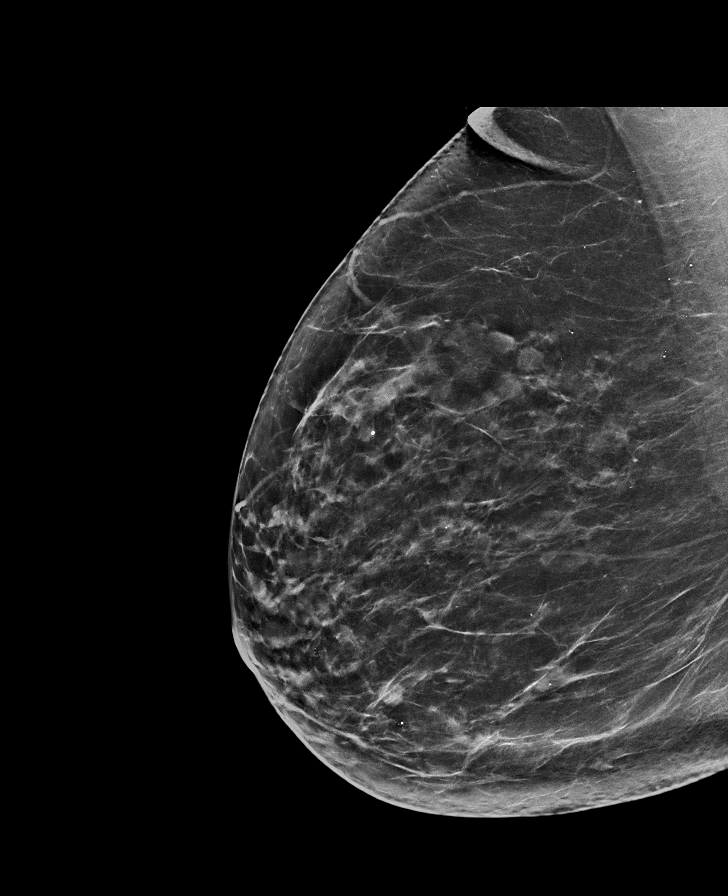

[L CC synth-2D]
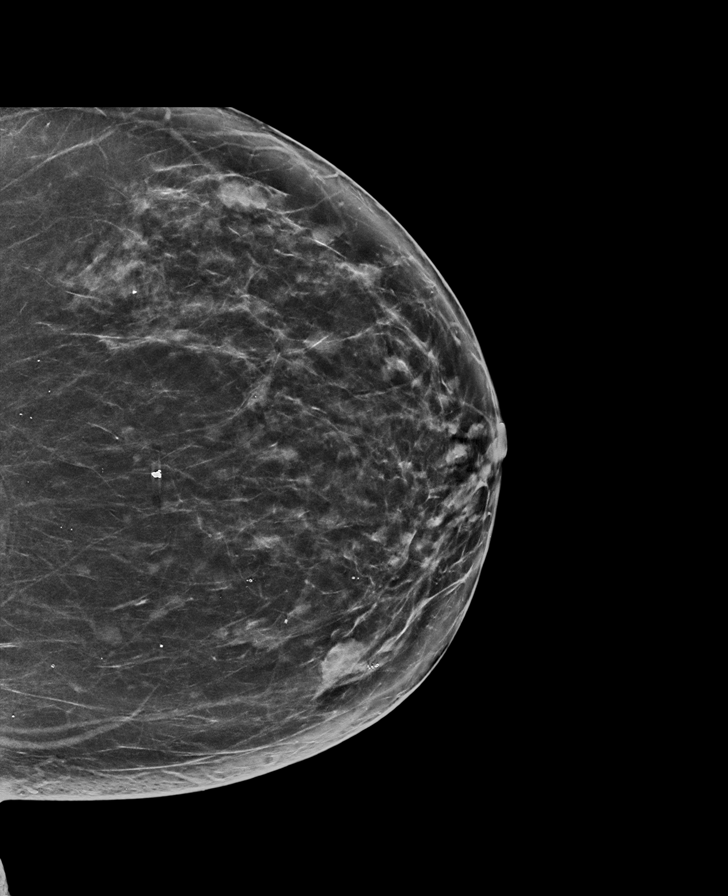

[L MLO synth-2D]
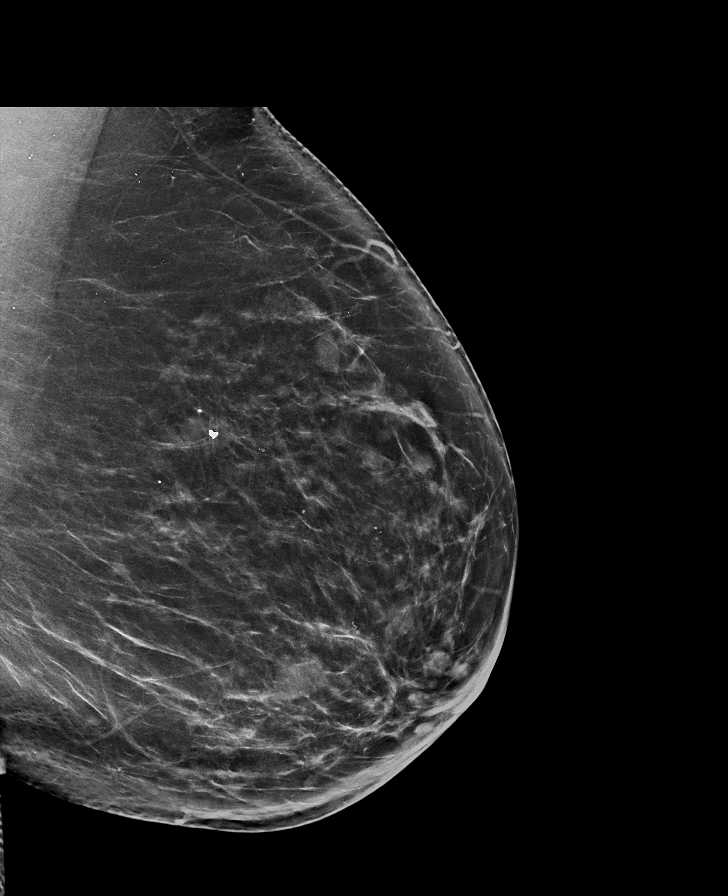

[L MLO tomo · tomo slice 45/90.0]
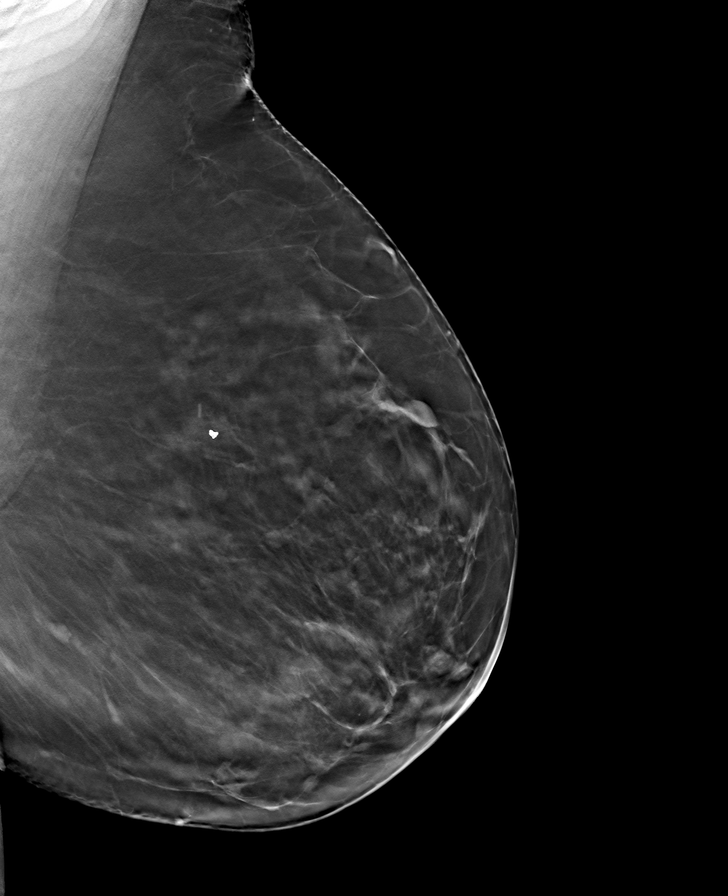

[L CC tomo · tomo slice 41/80.0]
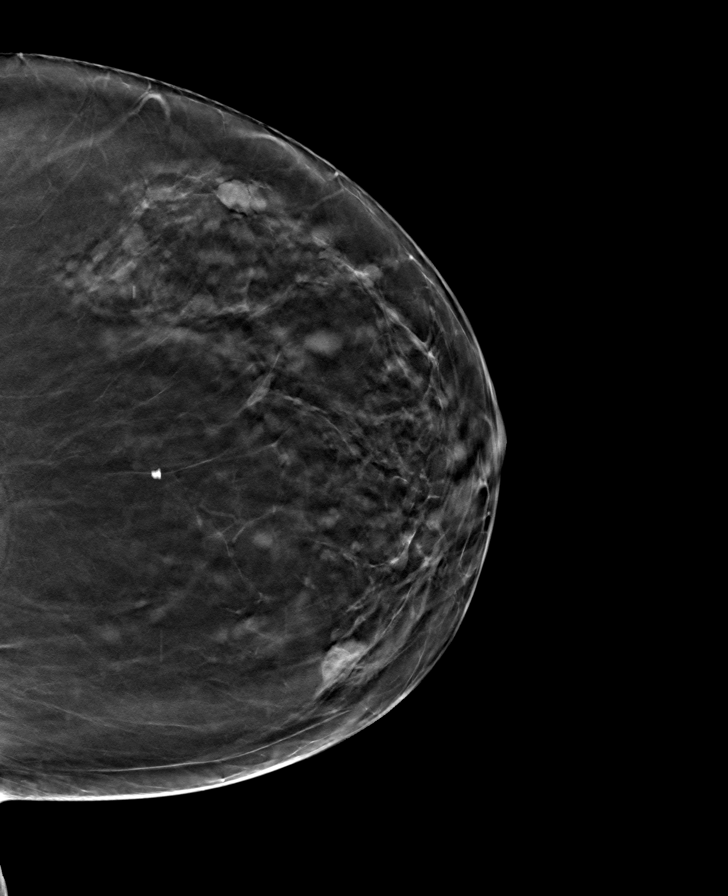

[R CC tomo · tomo slice 37/74.0]
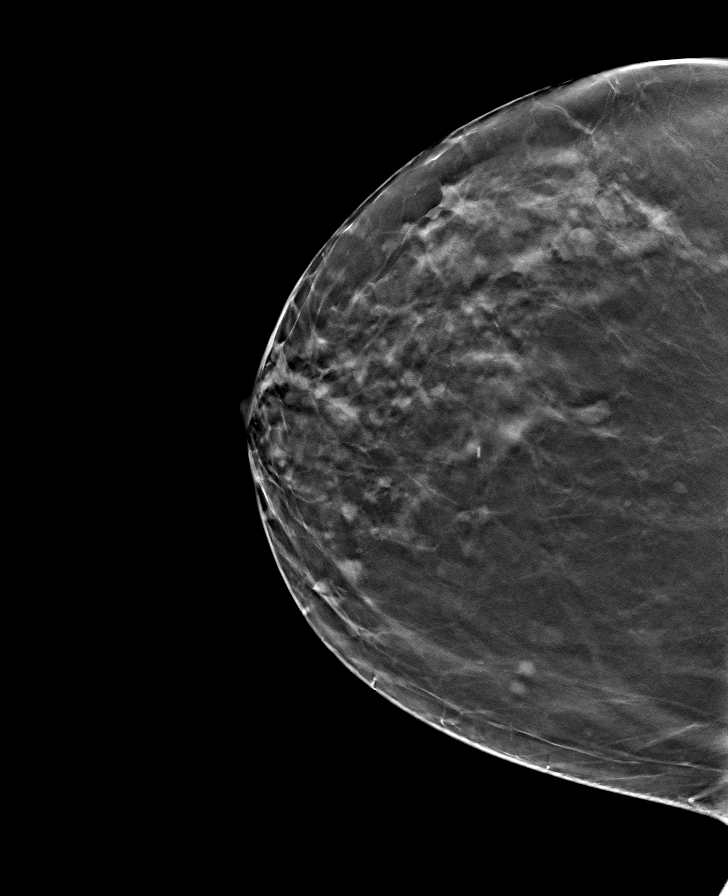

[R MLO tomo · tomo slice 41/82.0]
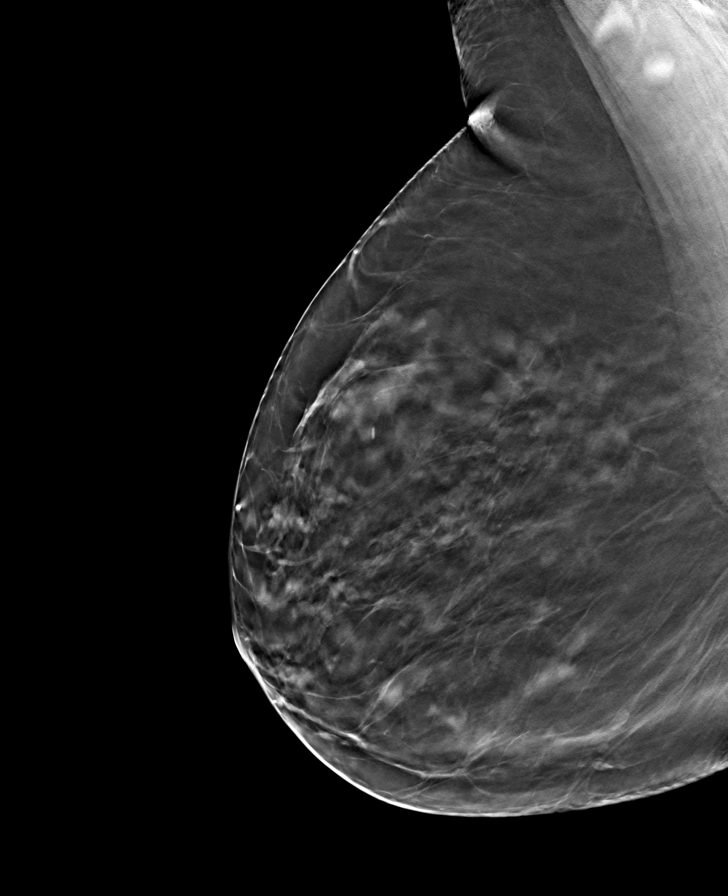

[8 of 24 positions shown; findings below may reference images not displayed]

ACR Breast Density Category c: The breast tissue is heterogeneously
dense, which may obscure small masses.
FINDINGS: There are no findings suspicious for malignancy. Images were
processed with CAD.
IMPRESSION: No mammographic evidence of malignancy. A result letter of this
screening mammogram will be mailed directly to the patient.

RECOMMENDATION:
Screening mammogram in one year. (Code:[id number]-U-LHB)

BI-RADS CATEGORY  1: Negative.

## 2018-12-09 ENCOUNTER — Other Ambulatory Visit: Payer: Self-pay | Admitting: Family Medicine

## 2018-12-09 DIAGNOSIS — Z1231 Encounter for screening mammogram for malignant neoplasm of breast: Secondary | ICD-10-CM

## 2019-01-09 ENCOUNTER — Ambulatory Visit
Admission: RE | Admit: 2019-01-09 | Discharge: 2019-01-09 | Disposition: A | Discharge status: Home / Self Care | Payer: Medicare Other | Source: Ambulatory Visit | Attending: Family Medicine | Admitting: Family Medicine

## 2019-01-09 ENCOUNTER — Other Ambulatory Visit: Payer: Self-pay

## 2019-01-09 DIAGNOSIS — Z1231 Encounter for screening mammogram for malignant neoplasm of breast: Secondary | ICD-10-CM | POA: Diagnosis not present

## 2019-02-11 ENCOUNTER — Other Ambulatory Visit: Payer: Self-pay | Admitting: Family Medicine

## 2019-02-11 DIAGNOSIS — Z780 Asymptomatic menopausal state: Secondary | ICD-10-CM

## 2019-02-25 ENCOUNTER — Inpatient Hospital Stay: Admission: RE | Admit: 2019-02-25 | Payer: Medicare Other | Source: Ambulatory Visit

## 2019-02-28 ENCOUNTER — Other Ambulatory Visit: Payer: Self-pay

## 2019-02-28 ENCOUNTER — Ambulatory Visit
Admission: RE | Admit: 2019-02-28 | Discharge: 2019-02-28 | Disposition: A | Discharge status: Home / Self Care | Payer: Medicare Other | Source: Ambulatory Visit | Attending: Family Medicine | Admitting: Family Medicine

## 2019-02-28 DIAGNOSIS — Z780 Asymptomatic menopausal state: Secondary | ICD-10-CM | POA: Diagnosis not present

## 2019-12-11 ENCOUNTER — Other Ambulatory Visit: Payer: Self-pay | Admitting: Family Medicine

## 2019-12-11 DIAGNOSIS — Z1231 Encounter for screening mammogram for malignant neoplasm of breast: Secondary | ICD-10-CM

## 2020-01-12 ENCOUNTER — Other Ambulatory Visit: Payer: Self-pay

## 2020-01-12 ENCOUNTER — Ambulatory Visit
Admission: RE | Admit: 2020-01-12 | Discharge: 2020-01-12 | Disposition: A | Discharge status: Home / Self Care | Payer: Medicare Other | Source: Ambulatory Visit | Attending: Family Medicine | Admitting: Family Medicine

## 2020-01-12 DIAGNOSIS — Z1231 Encounter for screening mammogram for malignant neoplasm of breast: Secondary | ICD-10-CM | POA: Diagnosis not present

## 2020-12-13 ENCOUNTER — Other Ambulatory Visit: Payer: Self-pay | Admitting: Family Medicine

## 2020-12-13 DIAGNOSIS — Z1231 Encounter for screening mammogram for malignant neoplasm of breast: Secondary | ICD-10-CM

## 2021-01-12 ENCOUNTER — Ambulatory Visit
Admission: RE | Admit: 2021-01-12 | Discharge: 2021-01-12 | Disposition: A | Discharge status: Home / Self Care | Payer: Medicare Other | Source: Ambulatory Visit | Attending: Family Medicine | Admitting: Family Medicine

## 2021-01-12 ENCOUNTER — Other Ambulatory Visit: Payer: Self-pay

## 2021-01-12 DIAGNOSIS — Z1231 Encounter for screening mammogram for malignant neoplasm of breast: Secondary | ICD-10-CM | POA: Diagnosis present

## 2021-09-09 ENCOUNTER — Other Ambulatory Visit: Payer: Self-pay | Admitting: Family Medicine

## 2021-09-12 ENCOUNTER — Other Ambulatory Visit: Payer: Self-pay | Admitting: Family Medicine

## 2021-09-12 DIAGNOSIS — M8580 Other specified disorders of bone density and structure, unspecified site: Secondary | ICD-10-CM

## 2021-09-12 DIAGNOSIS — Z780 Asymptomatic menopausal state: Secondary | ICD-10-CM

## 2021-10-05 ENCOUNTER — Ambulatory Visit
Admission: RE | Admit: 2021-10-05 | Discharge: 2021-10-05 | Disposition: A | Discharge status: Home / Self Care | Payer: Medicare Other | Source: Ambulatory Visit | Attending: Family Medicine | Admitting: Family Medicine

## 2021-10-05 DIAGNOSIS — Z780 Asymptomatic menopausal state: Secondary | ICD-10-CM | POA: Diagnosis present

## 2021-10-05 DIAGNOSIS — M8580 Other specified disorders of bone density and structure, unspecified site: Secondary | ICD-10-CM | POA: Diagnosis present
